# Patient Record
Sex: Female | Born: 1940 | Race: White | Hispanic: No | Smoking: Never smoker
Health system: Southern US, Community
[De-identification: ages and names within clinical notes are randomized; demographics above are authoritative.]

## PROBLEM LIST (undated history)

## (undated) DIAGNOSIS — E785 Hyperlipidemia, unspecified: Secondary | ICD-10-CM

## (undated) DIAGNOSIS — R7303 Prediabetes: Secondary | ICD-10-CM

## (undated) DIAGNOSIS — I10 Essential (primary) hypertension: Secondary | ICD-10-CM

## (undated) DIAGNOSIS — R002 Palpitations: Secondary | ICD-10-CM

## (undated) DIAGNOSIS — G4730 Sleep apnea, unspecified: Secondary | ICD-10-CM

## (undated) DIAGNOSIS — I341 Nonrheumatic mitral (valve) prolapse: Secondary | ICD-10-CM

## (undated) DIAGNOSIS — I422 Other hypertrophic cardiomyopathy: Secondary | ICD-10-CM

## (undated) HISTORY — PX: CARDIAC CATHETERIZATION: SHX172

---

## 2011-11-29 ENCOUNTER — Ambulatory Visit: Payer: Self-pay

## 2012-01-17 ENCOUNTER — Ambulatory Visit: Payer: Self-pay | Admitting: Family Medicine

## 2012-12-04 ENCOUNTER — Ambulatory Visit: Payer: Self-pay | Admitting: Internal Medicine

## 2012-12-16 HISTORY — PX: COLONOSCOPY: SHX174

## 2013-02-25 ENCOUNTER — Ambulatory Visit: Payer: Self-pay | Admitting: Family Medicine

## 2014-04-09 IMAGING — US US EXTREM NON VASC*R* COMPLETE
1 series · 14 of 15 positions shown · non-contrast
Comparison: none

REASON FOR EXAM: Thyroid nodule   RT Leg mass
COMMENTS:

[Series 1: us extrem non vasc*right* complete · 0.10mm/px · 14 of 15 slices shown]
[im 1/15]
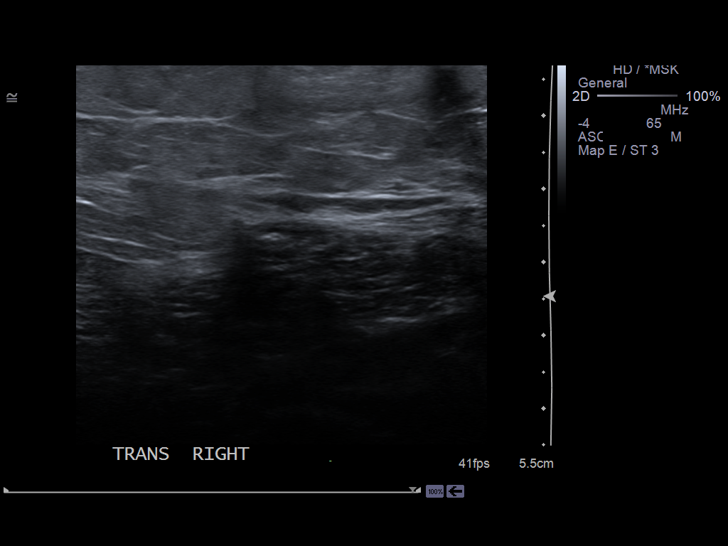
[im 2/15]
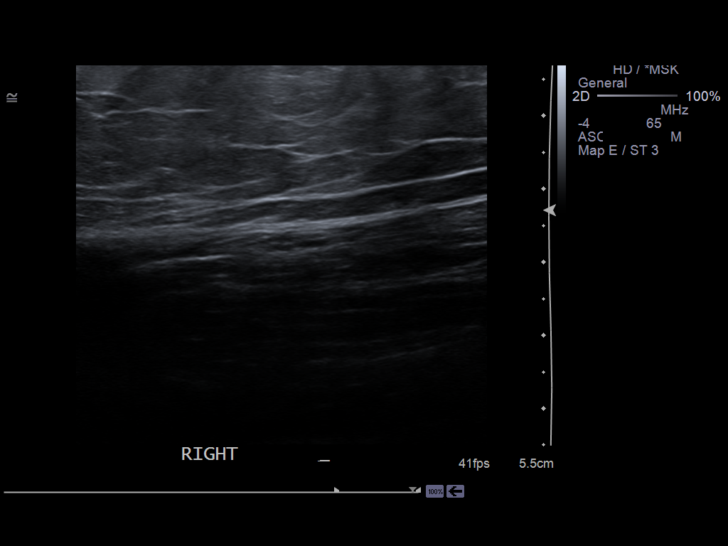
[im 3/15]
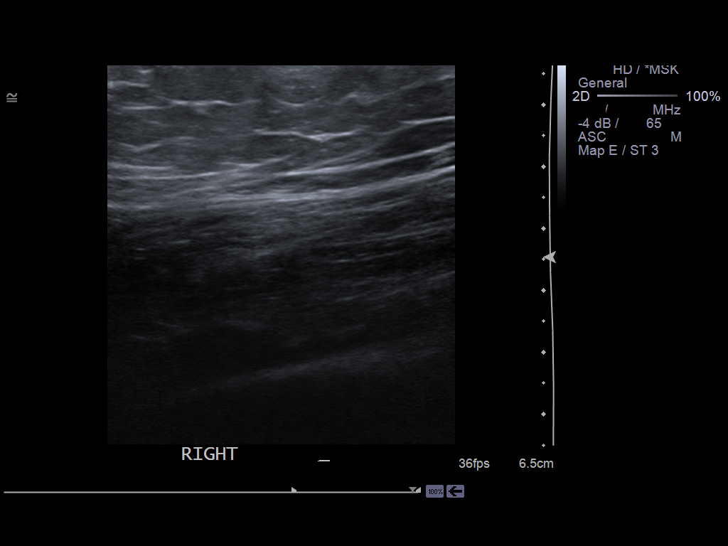
[im 4/15]
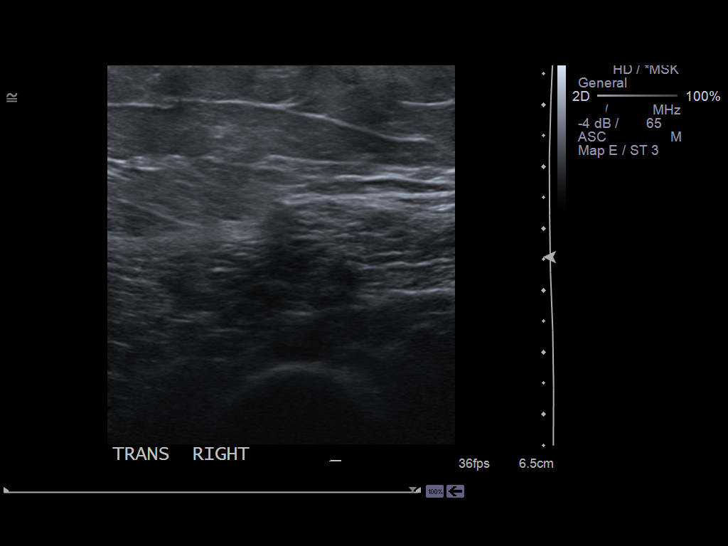
[im 5/15]
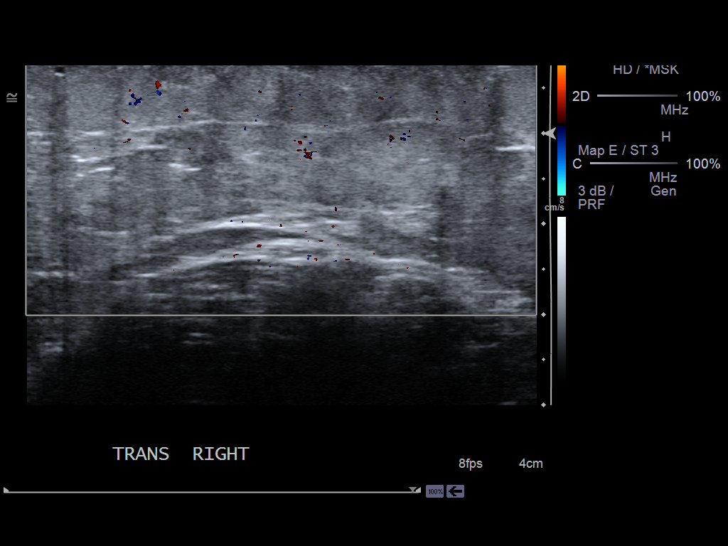
[im 6/15]
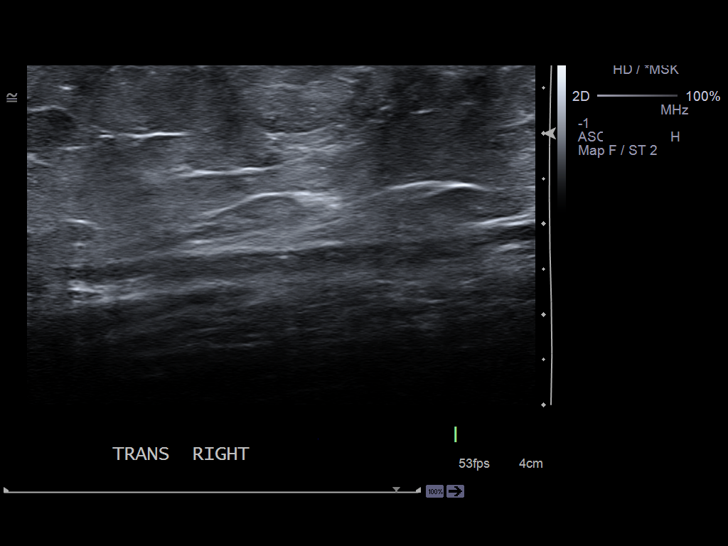
[im 7/15]
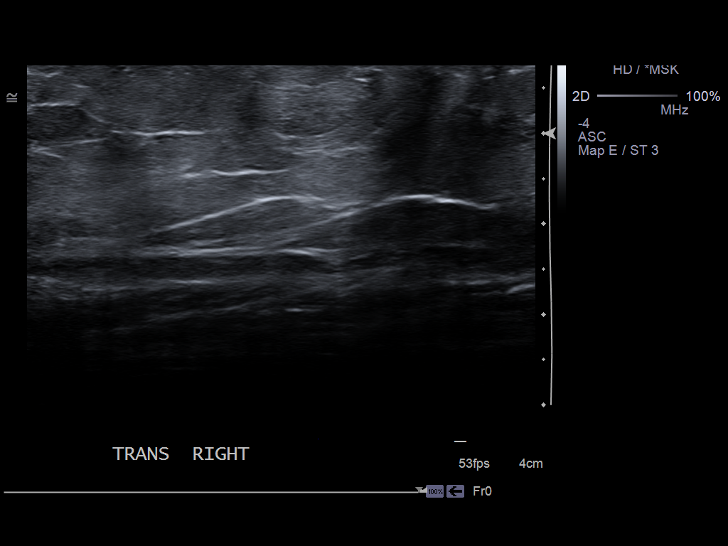
[im 9/15]
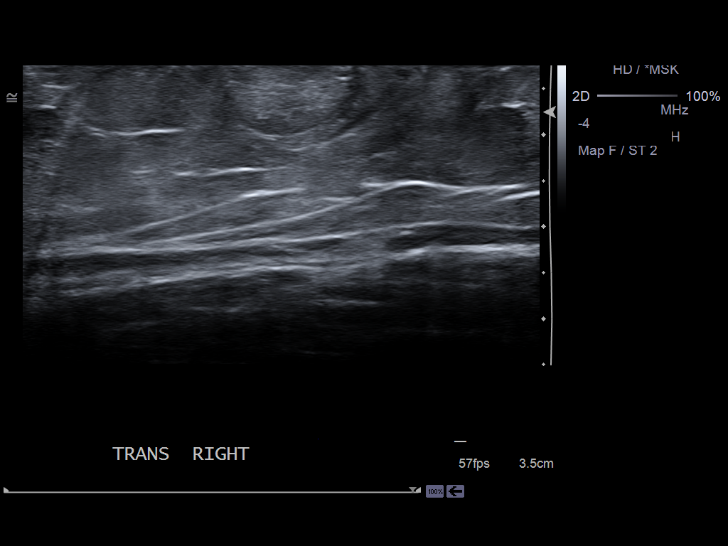
[im 10/15]
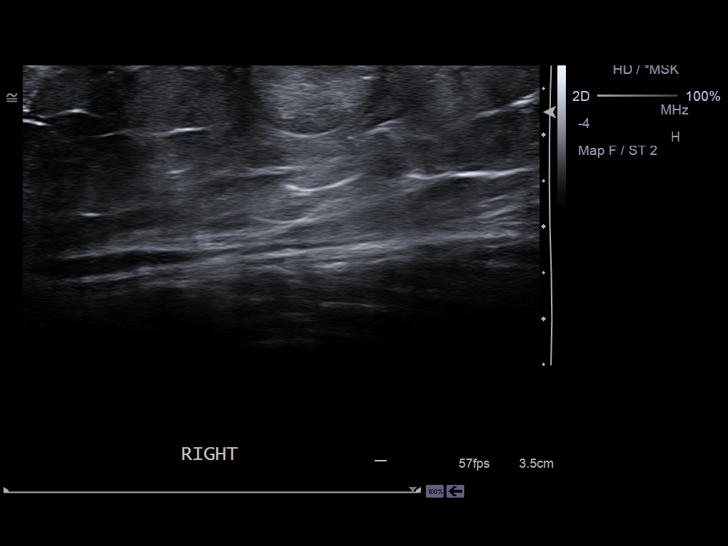
[im 11/15]
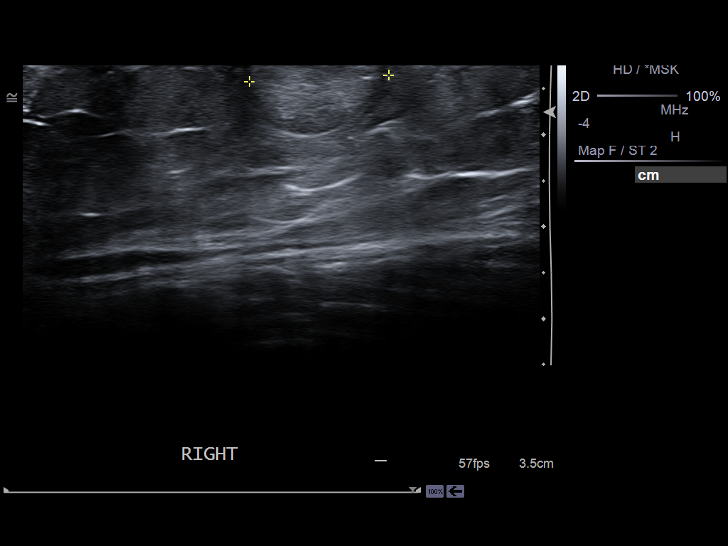
[im 12/15]
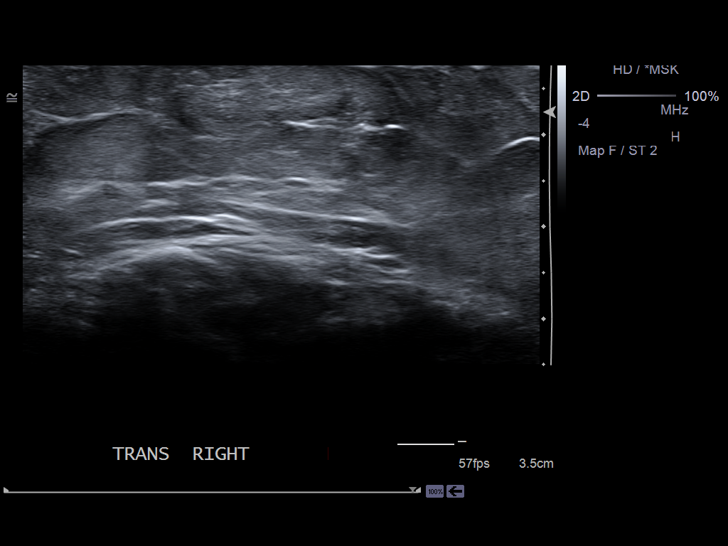
[im 13/15]
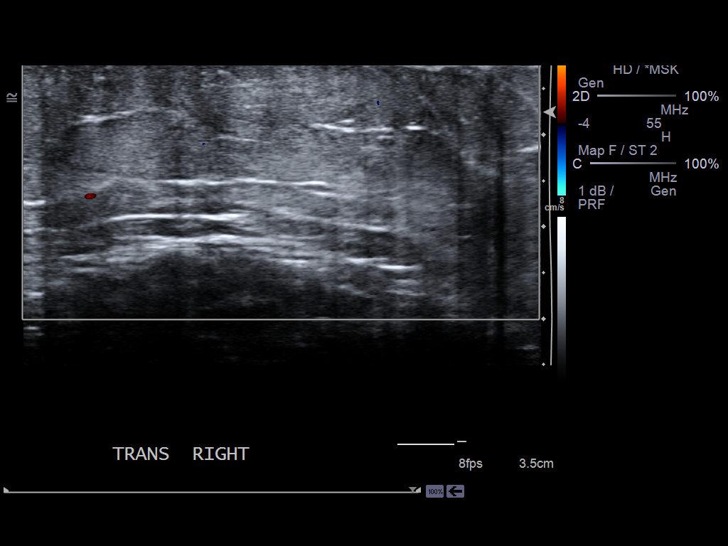
[im 14/15]
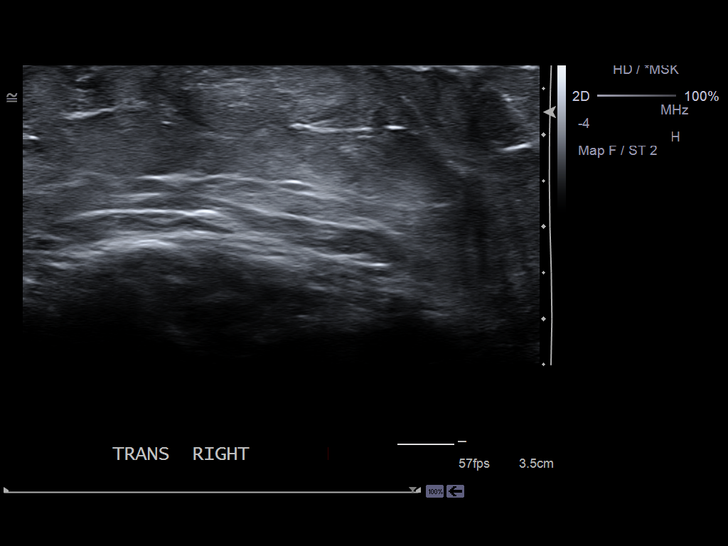
[im 15/15]
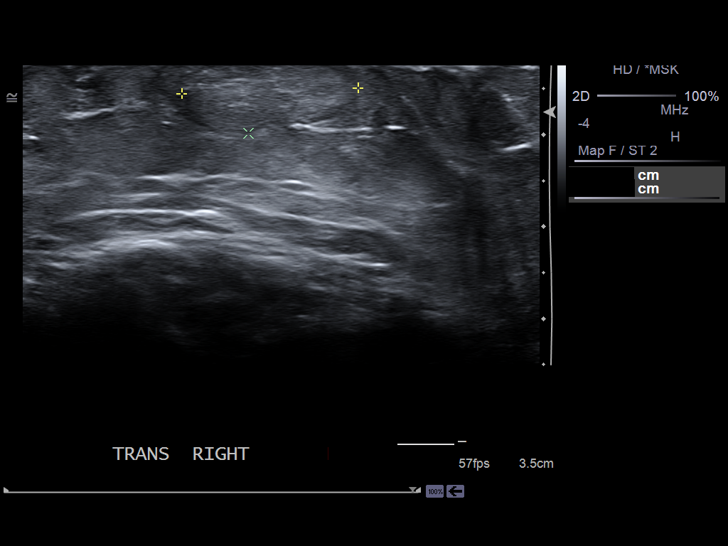

[14 of 15 positions shown; findings below may reference images not displayed]

PROCEDURE:     CEEJAY - CEEJAY EXTREMITY NON VASCULAR RIGHT  - February 25, 2013  [DATE]

RESULT:     Targeted ultrasound of the right thigh region is performed for a
palpable mass. There is a superficial area of that appears to be smoothly
defined measuring 1.5 to centimeters x 1.92 cm x 0.82 cm color Doppler
images show no definite intrinsic flow within this. The appearance is
nonspecific.
IMPRESSION: Superficial solid mass in the anterior right thigh. There
may be some incorporated fatty tissue within this given the slightly
increased echogenicity compared to the adjacent tissues.

[REDACTED]

## 2017-08-02 ENCOUNTER — Ambulatory Visit
Admission: EM | Admit: 2017-08-02 | Discharge: 2017-08-02 | Disposition: A | Discharge status: Home / Self Care | Payer: Medicare Other | Attending: Family Medicine | Admitting: Family Medicine

## 2017-08-02 ENCOUNTER — Other Ambulatory Visit: Payer: Self-pay

## 2017-08-02 ENCOUNTER — Ambulatory Visit: Payer: Medicare Other

## 2017-08-02 DIAGNOSIS — R7303 Prediabetes: Secondary | ICD-10-CM | POA: Insufficient documentation

## 2017-08-02 DIAGNOSIS — I10 Essential (primary) hypertension: Secondary | ICD-10-CM | POA: Insufficient documentation

## 2017-08-02 DIAGNOSIS — I4891 Unspecified atrial fibrillation: Secondary | ICD-10-CM | POA: Diagnosis not present

## 2017-08-02 DIAGNOSIS — E785 Hyperlipidemia, unspecified: Secondary | ICD-10-CM | POA: Insufficient documentation

## 2017-08-02 DIAGNOSIS — G4730 Sleep apnea, unspecified: Secondary | ICD-10-CM | POA: Insufficient documentation

## 2017-08-02 DIAGNOSIS — R0602 Shortness of breath: Secondary | ICD-10-CM | POA: Diagnosis present

## 2017-08-02 DIAGNOSIS — I422 Other hypertrophic cardiomyopathy: Secondary | ICD-10-CM | POA: Diagnosis not present

## 2017-08-02 HISTORY — DX: Essential (primary) hypertension: I10

## 2017-08-02 HISTORY — DX: Hyperlipidemia, unspecified: E78.5

## 2017-08-02 HISTORY — DX: Palpitations: R00.2

## 2017-08-02 HISTORY — DX: Other hypertrophic cardiomyopathy: I42.2

## 2017-08-02 HISTORY — DX: Prediabetes: R73.03

## 2017-08-02 HISTORY — DX: Sleep apnea, unspecified: G47.30

## 2017-08-02 HISTORY — DX: Nonrheumatic mitral (valve) prolapse: I34.1

## 2017-08-02 LAB — BASIC METABOLIC PANEL
Anion gap: 9 (ref 5–15)
BUN: 25 mg/dL — AB (ref 6–20)
CO2: 27 mmol/L (ref 22–32)
CREATININE: 0.93 mg/dL (ref 0.44–1.00)
Calcium: 9.4 mg/dL (ref 8.9–10.3)
Chloride: 100 mmol/L — ABNORMAL LOW (ref 101–111)
GFR calc Af Amer: >60 mL/min (ref >60)
GFR calc non Af Amer: 59 mL/min — ABNORMAL LOW (ref >60)
Glucose, Bld: 107 mg/dL — ABNORMAL HIGH (ref 65–99)
Potassium: 3.5 mmol/L (ref 3.5–5.1)
SODIUM: 136 mmol/L (ref 135–145)

## 2017-08-02 LAB — CBC WITH DIFFERENTIAL/PLATELET
Basophils Absolute: 0.1 10*3/uL (ref 0–0.1)
Basophils Relative: 1 %
EOS ABS: 0.1 10*3/uL (ref 0–0.7)
EOS PCT: 2 %
HCT: 40.8 % (ref 35.0–47.0)
Hemoglobin: 13.9 g/dL (ref 12.0–16.0)
LYMPHS ABS: 1.7 10*3/uL (ref 1.0–3.6)
Lymphocytes Relative: 25 %
MCH: 31.8 pg (ref 26.0–34.0)
MCHC: 34 g/dL (ref 32.0–36.0)
MCV: 93.4 fL (ref 80.0–100.0)
MONOS PCT: 9 %
Monocytes Absolute: 0.6 10*3/uL (ref 0.2–0.9)
Neutro Abs: 4.4 10*3/uL (ref 1.4–6.5)
Neutrophils Relative %: 63 %
PLATELETS: 220 10*3/uL (ref 150–440)
RBC: 4.37 MIL/uL (ref 3.80–5.20)
RDW: 13.3 % (ref 11.5–14.5)
WBC: 7 10*3/uL (ref 3.6–11.0)

## 2017-08-02 MED ORDER — RIVAROXABAN 20 MG PO TABS: 20.0000 mg | ORAL_TABLET | Freq: Every day | ORAL | 0 refills | Status: AC

## 2017-08-02 NOTE — ED Provider Notes (Signed)
[facility]    CSN: [id number] Arrival date & time: 08/02/17  1352     History   Chief Complaint Chief Complaint  Patient presents with  . Shortness of Breath    HPI Vanessa Randolph is a 76 y.o. female.   76 yo female with a h/o hypertrophic cardiomyopathy presents with a c/o worsening shortness of breath with exertion for the last 3 weeks. Denies any chest pains, jaw or arm pain, fevers, chills, cough, wheezing. Has felt palpitations.    The history is provided by the patient.    Past Medical History:  Diagnosis Date  . Hyperlipidemia   . Hypertension   . Hypertrophic nonobstructive cardiomyopathy (HCC)   . MVP (mitral valve prolapse)   . Palpitations   . Prediabetes   . Sleep apnea     There are no active problems to display for this patient.     OB History    No data available       Home Medications    Prior to Admission medications   Medication Sig Start Date End Date Taking? Authorizing Provider  amLODipine (NORVASC) 10 MG tablet Take 10 mg by mouth daily.   Yes [provider]  b complex vitamins tablet Take 1 tablet by mouth daily.   Yes [provider]  calcium carbonate (TUMS - DOSED IN MG ELEMENTAL CALCIUM) 500 MG chewable tablet Chew 1 tablet by mouth daily.   Yes [provider]  cholecalciferol (VITAMIN D) 1000 units tablet Take 1,000 Units by mouth daily.   Yes [provider]  eplerenone (INSPRA) 25 MG tablet Take 25 mg by mouth daily.   Yes [provider]  esomeprazole (NEXIUM) 20 MG packet Take 20 mg by mouth daily before breakfast.   Yes [provider]  losartan (COZAAR) 100 MG tablet Take 100 mg by mouth daily.   Yes [provider]  metoprolol succinate (TOPROL-XL) 100 MG 24 hr tablet Take 100 mg by mouth daily. Take with or immediately following a meal.   Yes [provider]  Multiple Vitamin (MULTIVITAMIN) tablet Take 1 tablet by mouth daily.   Yes  [provider]  Omega-3 Fatty Acids (FISH OIL) 1000 MG CAPS Take by mouth.   Yes [provider]  rivaroxaban (XARELTO) 20 MG TABS tablet Take 1 tablet (20 mg total) by mouth daily with supper. 08/02/17   Payton Mccallumonty, Nikala Walsworth, MD    Family History History reviewed. No pertinent family history.  Social History Social History   Tobacco Use  . Smoking status: Never Smoker  . Smokeless tobacco: Never Used  Substance Use Topics  . Alcohol use: No    Frequency: Never  . Drug use: No     Allergies   Erythromycin and Tetracyclines & related   Review of Systems Review of Systems   Physical Exam Triage Vital Signs ED Triage Vitals  Enc Vitals Group     BP 08/02/17 1437 131/82     Pulse Rate 08/02/17 1437 78     Resp 08/02/17 1437 17     Temp 08/02/17 1437 98.4 F (36.9 C)     Temp Source 08/02/17 1437 Oral     SpO2 08/02/17 1437 98 %     Weight 08/02/17 1434 190 lb (86.2 kg)     Height 08/02/17 1434 5' 1.5" (1.562 m)     Head Circumference --      Peak Flow --      Pain Score 08/02/17  1434 0     Pain Loc --      Pain Edu? --      Excl. in GC? --    No data found.  Updated Vital Signs BP 131/82 (BP Location: Left Arm)   Pulse 78   Temp 98.4 F (36.9 C) (Oral)   Resp 17   Ht 5' 1.5" (1.562 m)   Wt 190 lb (86.2 kg)   SpO2 98%   BMI 35.32 kg/m   Visual Acuity Right Eye Distance:   Left Eye Distance:   Bilateral Distance:    Right Eye Near:   Left Eye Near:    Bilateral Near:     Physical Exam  Constitutional: She appears well-developed and well-nourished. No distress.  HENT:  Head: Normocephalic and atraumatic.  Right Ear: Tympanic membrane, external ear and ear canal normal.  Left Ear: Tympanic membrane, external ear and ear canal normal.  Nose: No mucosal edema, rhinorrhea, nose lacerations, sinus tenderness, nasal deformity, septal deviation or nasal septal hematoma. No epistaxis.  No foreign bodies. Right sinus exhibits no maxillary  sinus tenderness and no frontal sinus tenderness. Left sinus exhibits no maxillary sinus tenderness and no frontal sinus tenderness.  Mouth/Throat: Uvula is midline, oropharynx is clear and moist and mucous membranes are normal. No oropharyngeal exudate.  Eyes: Conjunctivae and EOM are normal. Pupils are equal, round, and reactive to light. Right eye exhibits no discharge. Left eye exhibits no discharge. No scleral icterus.  Neck: Normal range of motion. Neck supple. No thyromegaly present.  Cardiovascular: Normal rate and intact distal pulses. An irregularly irregular rhythm present.  Pulmonary/Chest: Effort normal and breath sounds normal. No stridor. No respiratory distress. She has no wheezes. She has no rales.  Lymphadenopathy:    She has no cervical adenopathy.  Skin: She is not diaphoretic.  Nursing note and vitals reviewed.    UC Treatments / Results  Labs (all labs ordered are listed, but only abnormal results are displayed) Labs Reviewed  BASIC METABOLIC PANEL - Abnormal; Notable for the following components:      Result Value   Chloride 100 (*)    Glucose, Bld 107 (*)    BUN 25 (*)    GFR calc non Af Amer 59 (*)    All other components within normal limits  CBC WITH DIFFERENTIAL/PLATELET    EKG  EKG Interpretation None       Radiology Dg Chest 2 View  Result Date: 08/02/2017 CLINICAL DATA:  Shortness of breath EXAM: CHEST  2 VIEW COMPARISON:  None. FINDINGS: Lungs are clear.  No pleural effusion or pneumothorax. The heart is normal in size. Moderate hiatal hernia. Degenerative changes of the visualized thoracolumbar spine. IMPRESSION: No evidence of acute cardiopulmonary disease. Electronically Signed   By: Charline Bills M.D.   On: 08/02/2017 15:53    Procedures ED EKG Date/Time: 08/02/2017 8:00 PM Performed by: Payton Mccallum, MD Authorized by: Payton Mccallum, MD   ECG reviewed by ED Physician in the absence of a cardiologist: yes   Previous ECG:     Previous ECG:  Unavailable Interpretation:    Interpretation: abnormal   Rate:    ECG rate assessment: normal   Rhythm:    Rhythm: atrial fibrillation   Ectopy:    Ectopy: none   QRS:    QRS axis:  Normal ST segments:    ST segments:  Normal T waves:    T waves: normal     (including critical care time)  Medications  Ordered in UC Medications - No data to display   Initial Impression / Assessment and Plan / UC Course  I have reviewed the triage vital signs and the nursing notes.  Pertinent labs & imaging results that were available during my care of the patient were reviewed by me and considered in my medical decision making (see chart for details).       Final Clinical Impressions(s) / UC Diagnoses   Final diagnoses:  Atrial fibrillation, new onset (HCC)  (stable, rate controlled)  ED Discharge Orders        Ordered    rivaroxaban (XARELTO) 20 MG TABS tablet  Daily with supper     08/02/17 1646     1. Labs/x-ray/ekg results and diagnosis reviewed with patient 2. rx as per orders above; reviewed possible side effects, interactions, risks and benefits  3. Recommend continue current home medications 4. Follow-up with cardiologist on Monday   Controlled Substance Prescriptions [place] Controlled Substance Registry consulted? Not Applicable   Payton Mccallumonty, Larkyn Greenberger, MD 08/02/17 2024

## 2017-08-02 NOTE — ED Triage Notes (Signed)
Patient complains of shortness of breath that has been worsening over last 3 weeks. Patient states that [facility]. Patient states that she has been noticing short of breath symptoms worsening, noticed today while walking from car to building. Patient reports that she has Cardiomyopathy and believes that she is having more frequent arrythmias. Patient states that they have got worse to the point that she has hit herself on the chest. Patient is concerned that she may have had a cardiac event that she ignored 3 weeks ago.

## 2017-08-02 NOTE — Discharge Instructions (Signed)
Follow-up with your cardiologist on Monday.

## 2018-09-14 IMAGING — CR DG CHEST 2V
2 series · 2 of 2 positions shown · non-contrast
Comparison: None.

CLINICAL DATA: Shortness of breath

EXAM:
CHEST  2 VIEW

[chest pa]
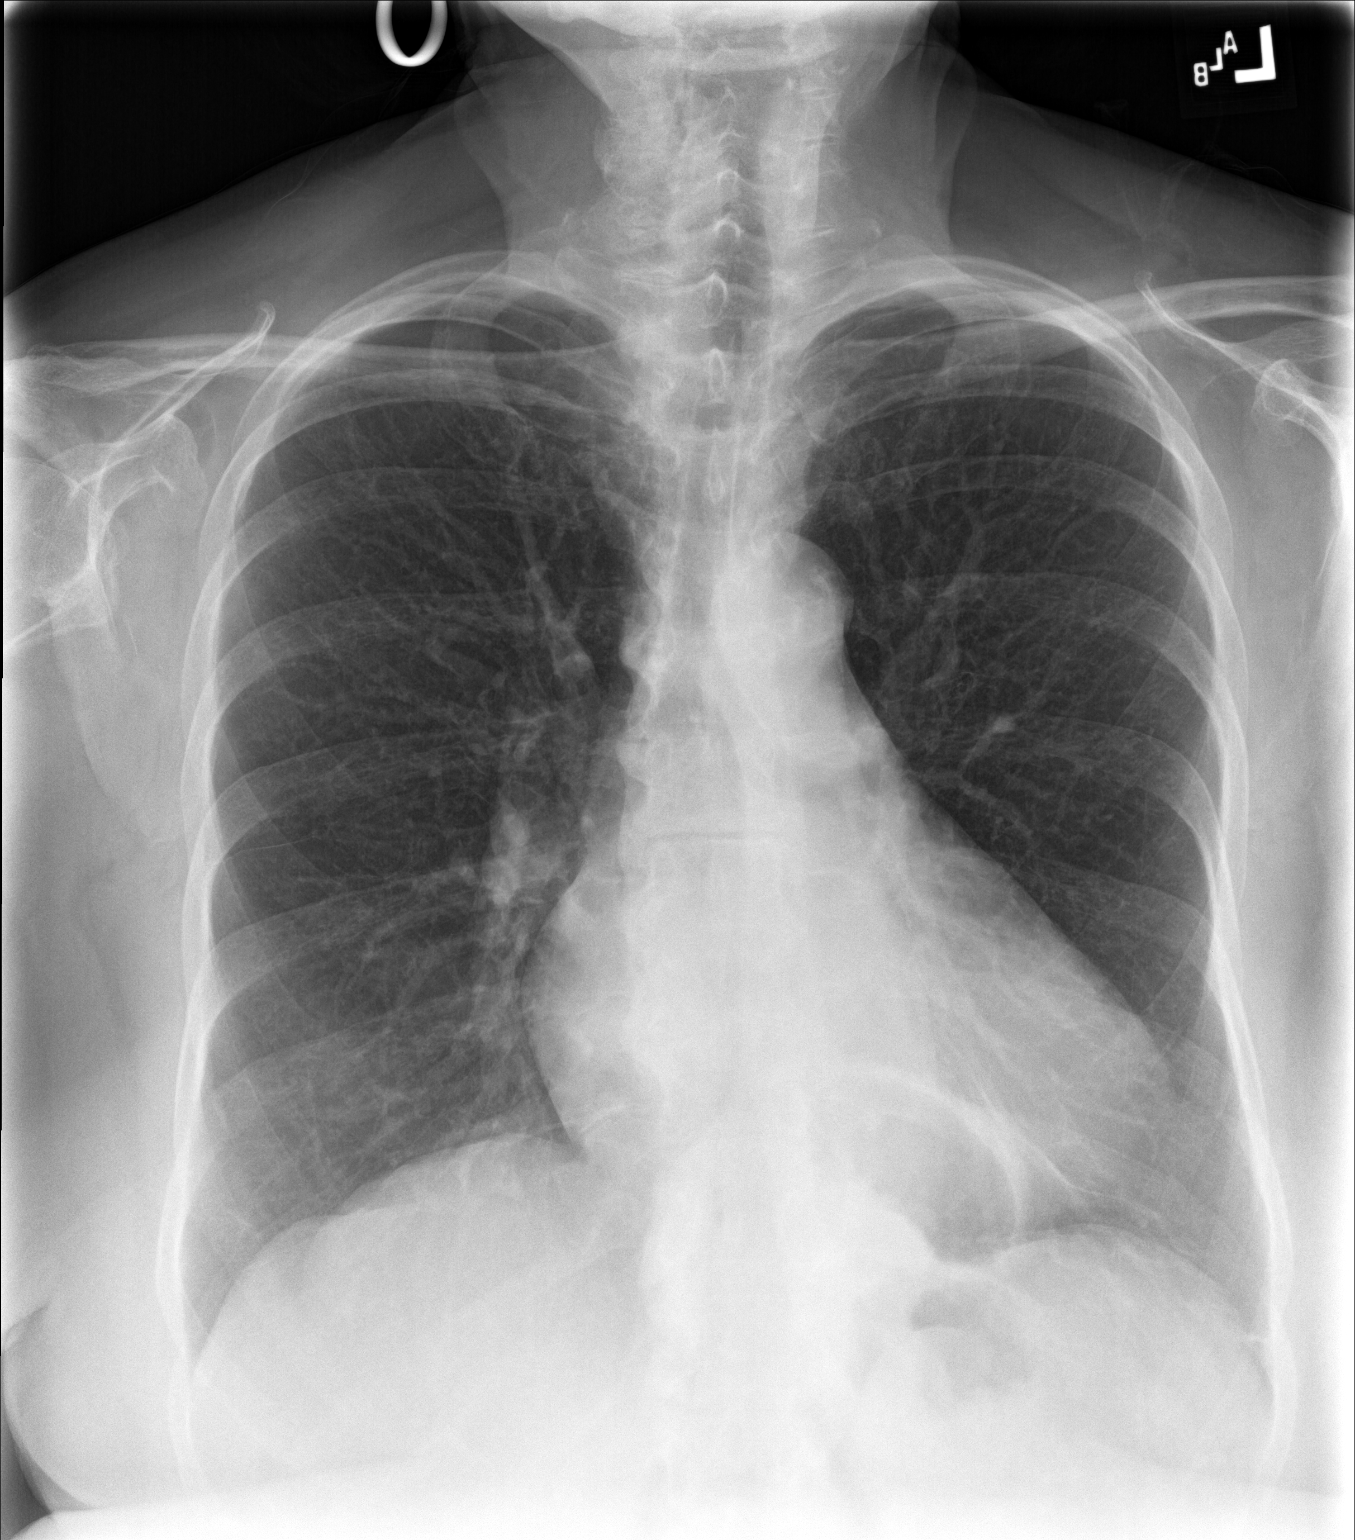

[chest lat]
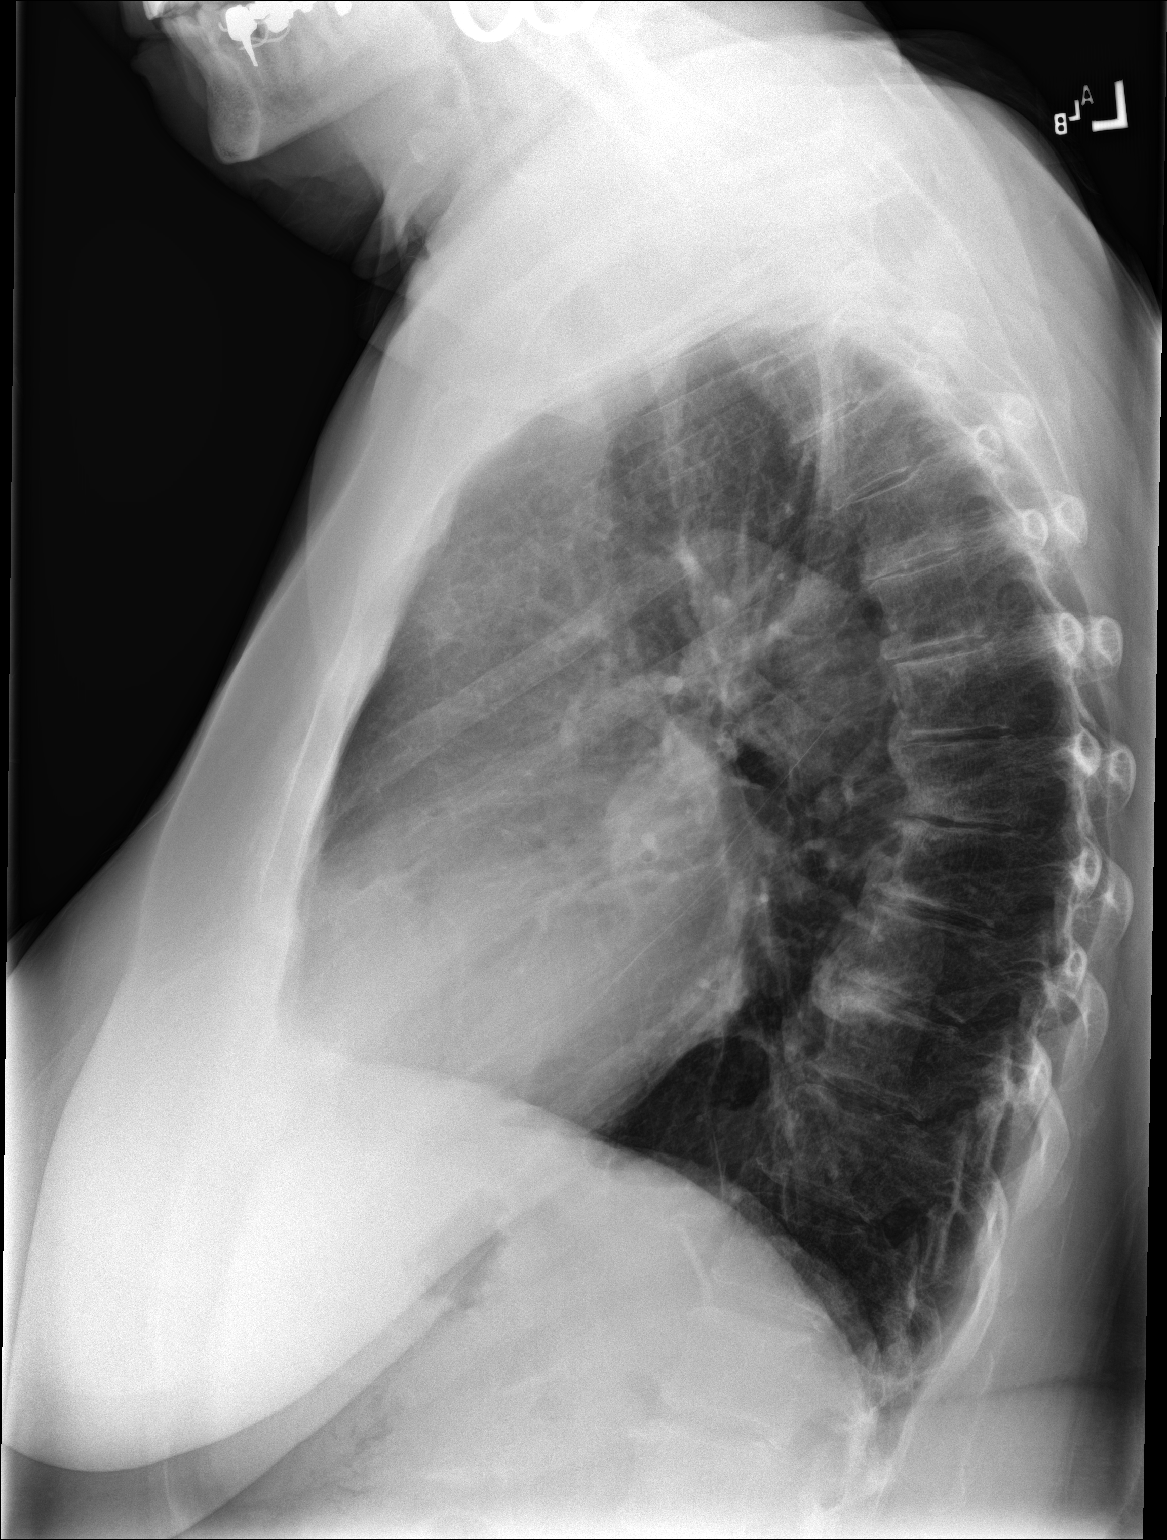

[2 of 2 positions shown; findings below may reference images not displayed]

FINDINGS: Lungs are clear.  No pleural effusion or pneumothorax.

The heart is normal in size.

Moderate hiatal hernia.

Degenerative changes of the visualized thoracolumbar spine.
IMPRESSION: No evidence of acute cardiopulmonary disease.
